# Patient Record
Sex: Female | Born: 1995 | Race: Black or African American | Hispanic: No | Marital: Single | Smoking: Never smoker
Health system: Southern US, Community
[De-identification: ages and names within clinical notes are randomized; demographics above are authoritative.]

## PROBLEM LIST (undated history)

## (undated) DIAGNOSIS — R21 Rash and other nonspecific skin eruption: Secondary | ICD-10-CM

## (undated) DIAGNOSIS — R079 Chest pain, unspecified: Secondary | ICD-10-CM

## (undated) DIAGNOSIS — I319 Disease of pericardium, unspecified: Secondary | ICD-10-CM

## (undated) DIAGNOSIS — J45909 Unspecified asthma, uncomplicated: Secondary | ICD-10-CM

## (undated) DIAGNOSIS — E669 Obesity, unspecified: Secondary | ICD-10-CM

## (undated) HISTORY — DX: Rash and other nonspecific skin eruption: R21

## (undated) HISTORY — DX: Unspecified asthma, uncomplicated: J45.909

## (undated) HISTORY — DX: Obesity, unspecified: E66.9

## (undated) HISTORY — DX: Disease of pericardium, unspecified: I31.9

## (undated) HISTORY — DX: Chest pain, unspecified: R07.9

---

## 2017-12-17 ENCOUNTER — Encounter (INDEPENDENT_AMBULATORY_CARE_PROVIDER_SITE_OTHER): Payer: Self-pay

## 2017-12-17 ENCOUNTER — Encounter: Payer: Self-pay | Admitting: Interventional Cardiology

## 2017-12-17 ENCOUNTER — Ambulatory Visit: Payer: PRIVATE HEALTH INSURANCE | Admitting: Interventional Cardiology

## 2017-12-17 VITALS — BP 110/82 | HR 76 | Ht 62.0 in | Wt 233.2 lb

## 2017-12-17 DIAGNOSIS — Z8679 Personal history of other diseases of the circulatory system: Secondary | ICD-10-CM

## 2017-12-17 DIAGNOSIS — R0781 Pleurodynia: Secondary | ICD-10-CM | POA: Diagnosis not present

## 2017-12-17 MED ORDER — COLCHICINE 0.6 MG PO TABS: 0.6000 mg | ORAL_TABLET | Freq: Every day | ORAL | 3 refills | Status: AC

## 2017-12-17 NOTE — Progress Notes (Signed)
Cardiology Office Note    Date:  12/17/2017   ID:  Briana Kidd, DOB December 15, 1995, MRN [id number]  PCP:  System, Pcp Not In  Cardiologist: Lesleigh Noe, MD   Chief Complaint  Patient presents with  . Chest Pain    Pericarditis    History of Present Illness:  Briana Kidd is a 22 y.o. female referred from primary care Briana Doffing MD, for pleuritic chest pain evaluation.  Has prior history of pericarditis and reactive airways disease.  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  She is from the [place] part of [place].  She began having chest pain in August 2018.  She eventually had the diagnosis of acute pericarditis made by Dr.Tiwari.  The clinical diagnosis was made based upon the quality of pain and concern for the presence of ST elevation on EKG.  Subsequent EKGs have not shown any evolutionary pattern or change in appearance.  She used high dose ibuprofen and colchicine without resolution of symptoms.  She is here today because she is run out of colchicine and wants to continue the medication.  States that the discomfort gets slightly worse with heavy physical activity such as running and physical education classes.  Of activities have no impact.  The discomfort occurs in different locations in the chest including left parasternal upper and lower as well as in the right lateral chest.  She is currently having 7 out of 10 intensity discomfort although is able to carry on a conversation and appears in no distress.   Past Medical History:  Diagnosis Date  . Chest pain   . Obesity   . Pericarditis   . RAD (reactive airway disease)   . Rash       Current Medications: Outpatient Medications Prior to Visit  Medication Sig Dispense Refill  . Multiple Vitamin (MULTIVITAMIN WITH MINERALS) TABS tablet Take 1 tablet by mouth daily.    . colchicine 0.6 MG tablet Take 0.6 mg by mouth daily.    Marland Kitchen ibuprofen  (ADVIL,MOTRIN) 600 MG tablet Take 600 mg by mouth 3 (three) times daily.    Marland Kitchen PRAMOXINE HCL EX 60 g. APPLY TO AFFECTED AREA THREE TIMES A DAY     No facility-administered medications prior to visit.      Allergies:   Patient has no known allergies.   Social History   Socioeconomic History  . Marital status: Single    Spouse name: None  . Number of children: None  . Years of education: None  . Highest education level: None  Social Needs  . Financial resource strain: None  . Food insecurity - worry: None  . Food insecurity - inability: None  . Transportation needs - medical: None  . Transportation needs - non-medical: None  Occupational History  . None  Tobacco Use  . Smoking status: Never Smoker  . Smokeless tobacco: Never Used  Substance and Sexual Activity  . Alcohol use: No    Frequency: Never  . Drug use: No  . Sexual activity: None  Other Topics Concern  . None  Social History Narrative  . None     Family History:  The patient's family history includes Diabetes in her maternal grandmother and son; Hypertension in her father and mother.   ROS:   Please see the history of present illness.    Episodes of chest pressure.  But also described as a soreness in the chest.  Certain movements and positions can exacerbate the discomfort.  Occurrences are completely random.  Exertional discomfort is not predictable.  Discomfort has variable threshold when it does occur.  All other systems reviewed and are negative.   PHYSICAL EXAM:   VS:  BP 110/82   Pulse 76   Ht 5\' 2"  (1.575 m)   Wt 233 lb 3.2 oz (105.8 kg)   BMI 42.65 kg/m    GEN: Well nourished, well developed, in no acute distress.  Morbidly obese. HEENT: normal  Neck: no JVD, carotid bruits, or masses Cardiac: RRR; no murmurs, rubs, or gallops,no edema  Respiratory:  clear to auscultation bilaterally, normal work of breathing GI: soft, nontender, nondistended, + BS MS: no deformity or atrophy  Skin: warm and  dry, no rash Neuro:  Alert and Oriented x 3, Strength and sensation are intact Psych: euthymic mood, full affect  Wt Readings from Last 3 Encounters:  12/17/17 233 lb 3.2 oz (105.8 kg)      Studies/Labs Reviewed:   EKG:  EKG normal sinus rhythm with probable early repolarization.  No change when compared to historical EKG from December 2018.  Recent Labs: No results found for requested labs within last 8760 hours.   Lipid Panel No results found for: CHOL, TRIG, HDL, CHOLHDL, VLDL, LDLCALC, LDLDIRECT  Additional studies/ records that were reviewed today include:  2D Doppler echocardiogram 06/24/2017: Conclusion The left ventricular size, thickness and function are normal. Ejection Fraction = 60-65%. The left ventricular wall motion is normal. Normal left ventricular diastolic function. There is trace tricuspid regurgitation.  2018 Sedimentation rate in the 25-28 range. 2018 high sensitivity CRP 9.3 (December 2018) <--11.3 (August 2018)   ASSESSMENT:    1. History of pericarditis   2. Pleuritic chest pain      PLAN:  In order of problems listed above:  1. If it was previously present it was based on clinical accumulation of data but no real objective evidence such as diagnostic EKG or the presence of a pericardial friction rub.  The diagnosis is clinical without objective findings of rub, diagnostic EKG, or pericardial effusion. 2. Uncertain etiology.  Could be costochondritis syndrome.  Current plan is clinical observation.  Discontinue ibuprofen as it does not appear to be helping very much.  Continue colchicine 0.6 mg/day for another several months and then we will discontinue.  Clinical follow-up in 3 months for reevaluation.  Considerations for the future could include connective tissue disease workup, and chest CT.    Medication Adjustments/Labs and Tests Ordered: Current medicines are reviewed at length with the patient today.  Concerns regarding medicines are  outlined above.  Medication changes, Labs and Tests ordered today are listed in the Patient Instructions below. Patient Instructions  Medication Instructions:  1) DISCONTINUE Ibuprofen 2) Continue Colchicine 0.6mg  once daily  Labwork: None  Testing/Procedures: None  Follow-Up: Your physician recommends that you schedule a follow-up appointment in: 3 months with Dr. Katrinka BlazingSmith.    Any Other Special Instructions Will Be Listed Below (If Applicable).     If you need a refill on your cardiac medications before your next appointment, please call your pharmacy.      Signed, Lesleigh NoeHenry W Eluzer Howdeshell III, MD  12/17/2017 3:58 PM    [facility] [address], [place], [place]  [address] Phone: [phone]) [phone]; Fax: [phone]) [phone]

## 2017-12-17 NOTE — Patient Instructions (Signed)
Medication Instructions:  1) DISCONTINUE Ibuprofen 2) Continue Colchicine 0.6mg  once daily  Labwork: None  Testing/Procedures: None  Follow-Up: Your physician recommends that you schedule a follow-up appointment in: 3 months with Dr. Katrinka BlazingSmith.    Any Other Special Instructions Will Be Listed Below (If Applicable).     If you need a refill on your cardiac medications before your next appointment, please call your pharmacy.

## 2018-03-03 ENCOUNTER — Encounter: Payer: Self-pay | Admitting: Interventional Cardiology

## 2018-03-16 NOTE — Progress Notes (Deleted)
Cardiology Office Note    Date:  03/16/2018   ID:  Briana Kidd, DOB 12-26-1995, MRN [id number]  PCP:  System, Pcp Not In  Cardiologist: Lesleigh Noe, MD   No chief complaint on file.   History of Present Illness:  Briana Kidd is a 22 y.o. female with h/o pleuritic chest pain and possibly prior pericarditis.    Past Medical History:  Diagnosis Date  . Chest pain   . Obesity   . Pericarditis   . RAD (reactive airway disease)   . Rash     No past surgical history on file.  Current Medications: Outpatient Medications Prior to Visit  Medication Sig Dispense Refill  . colchicine 0.6 MG tablet Take 1 tablet (0.6 mg total) by mouth daily. 90 tablet 3  . Multiple Vitamin (MULTIVITAMIN WITH MINERALS) TABS tablet Take 1 tablet by mouth daily.     No facility-administered medications prior to visit.      Allergies:   Patient has no known allergies.   Social History   Socioeconomic History  . Marital status: Single    Spouse name: Not on file  . Number of children: Not on file  . Years of education: Not on file  . Highest education level: Not on file  Occupational History  . Not on file  Social Needs  . Financial resource strain: Not on file  . Food insecurity:    Worry: Not on file    Inability: Not on file  . Transportation needs:    Medical: Not on file    Non-medical: Not on file  Tobacco Use  . Smoking status: Never Smoker  . Smokeless tobacco: Never Used  Substance and Sexual Activity  . Alcohol use: No    Frequency: Never  . Drug use: No  . Sexual activity: Not on file  Lifestyle  . Physical activity:    Days per week: Not on file    Minutes per session: Not on file  . Stress: Not on file  Relationships  . Social connections:    Talks on phone: Not on file    Gets together: Not on file    Attends religious service: Not on file    Active member of club or organization: Not on file    Attends meetings of clubs or organizations: Not on  file    Relationship status: Not on file  Other Topics Concern  . Not on file  Social History Narrative  . Not on file     Family History:  The patient's ***family history includes Diabetes in her maternal grandmother and son; Hypertension in her father and mother.   ROS:   Please see the history of present illness.    ***  All other systems reviewed and are negative.   PHYSICAL EXAM:   VS:  There were no vitals taken for this visit.   GEN: Well nourished, well developed, in no acute distress  HEENT: normal  Neck: no JVD, carotid bruits, or masses Cardiac: ***RRR; no murmurs, rubs, or gallops,no edema  Respiratory:  clear to auscultation bilaterally, normal work of breathing GI: soft, nontender, nondistended, + BS MS: no deformity or atrophy  Skin: warm and dry, no rash Neuro:  Alert and Oriented x 3, Strength and sensation are intact Psych: euthymic mood, full affect  Wt Readings from Last 3 Encounters:  12/17/17 233 lb 3.2 oz (105.8 kg)      Studies/Labs Reviewed:   EKG:  EKG  ***  Recent Labs: No results found for requested labs within last 8760 hours.   Lipid Panel No results found for: CHOL, TRIG, HDL, CHOLHDL, VLDL, LDLCALC, LDLDIRECT  Additional studies/ records that were reviewed today include:  ***    ASSESSMENT:    No diagnosis found.   PLAN:  In order of problems listed above:  1. ***    Medication Adjustments/Labs and Tests Ordered: Current medicines are reviewed at length with the patient today.  Concerns regarding medicines are outlined above.  Medication changes, Labs and Tests ordered today are listed in the Patient Instructions below. There are no Patient Instructions on file for this visit.   Signed, Lesleigh Noe, MD  03/16/2018 10:08 PM    [facility] [address] [place], [place], [place]  [address] Phone: [phone]; Fax: [phone]

## 2018-03-17 ENCOUNTER — Ambulatory Visit: Payer: PRIVATE HEALTH INSURANCE | Admitting: Interventional Cardiology

## 2018-03-17 DIAGNOSIS — R0989 Other specified symptoms and signs involving the circulatory and respiratory systems: Secondary | ICD-10-CM

## 2018-03-18 ENCOUNTER — Encounter: Payer: Self-pay | Admitting: Interventional Cardiology

## 2018-04-04 ENCOUNTER — Emergency Department (HOSPITAL_COMMUNITY)
Admission: EM | Admit: 2018-04-04 | Discharge: 2018-04-04 | Disposition: A | Discharge status: Home / Self Care | Payer: PRIVATE HEALTH INSURANCE | Attending: Emergency Medicine | Admitting: Emergency Medicine

## 2018-04-04 ENCOUNTER — Emergency Department (HOSPITAL_COMMUNITY): Payer: PRIVATE HEALTH INSURANCE

## 2018-04-04 ENCOUNTER — Encounter (HOSPITAL_COMMUNITY): Payer: Self-pay | Admitting: Emergency Medicine

## 2018-04-04 ENCOUNTER — Other Ambulatory Visit: Payer: Self-pay

## 2018-04-04 DIAGNOSIS — R0789 Other chest pain: Secondary | ICD-10-CM | POA: Insufficient documentation

## 2018-04-04 DIAGNOSIS — Z79899 Other long term (current) drug therapy: Secondary | ICD-10-CM | POA: Diagnosis not present

## 2018-04-04 HISTORY — DX: Unspecified asthma, uncomplicated: J45.909

## 2018-04-04 LAB — BASIC METABOLIC PANEL
ANION GAP: 7 (ref 5–15)
BUN: 9 mg/dL (ref 6–20)
CALCIUM: 9.3 mg/dL (ref 8.9–10.3)
CO2: 27 mmol/L (ref 22–32)
Chloride: 106 mmol/L (ref 101–111)
Creatinine, Ser: 0.74 mg/dL (ref 0.44–1.00)
GFR calc Af Amer: >60 mL/min (ref >60)
GFR calc non Af Amer: >60 mL/min (ref >60)
Glucose, Bld: 107 mg/dL — ABNORMAL HIGH (ref 65–99)
Potassium: 3.9 mmol/L (ref 3.5–5.1)
Sodium: 140 mmol/L (ref 135–145)

## 2018-04-04 LAB — CBC
HCT: 39.4 % (ref 36.0–46.0)
HEMOGLOBIN: 12.5 g/dL (ref 12.0–15.0)
MCH: 26 pg (ref 26.0–34.0)
MCHC: 31.7 g/dL (ref 30.0–36.0)
MCV: 82.1 fL (ref 78.0–100.0)
Platelets: 397 10*3/uL (ref 150–400)
RBC: 4.8 MIL/uL (ref 3.87–5.11)
RDW: 13.6 % (ref 11.5–15.5)
WBC: 8.4 10*3/uL (ref 4.0–10.5)

## 2018-04-04 LAB — I-STAT BETA HCG BLOOD, ED (MC, WL, AP ONLY)

## 2018-04-04 LAB — I-STAT TROPONIN, ED: TROPONIN I, POC: 0 ng/mL (ref 0.00–0.08)

## 2018-04-04 MED ORDER — ORPHENADRINE CITRATE ER 100 MG PO TB12: 100.0000 mg | ORAL_TABLET | Freq: Two times a day (BID) | ORAL | 0 refills | Status: AC

## 2018-04-04 MED ORDER — NAPROXEN 500 MG PO TABS: 500.0000 mg | ORAL_TABLET | Freq: Two times a day (BID) | ORAL | 0 refills | Status: AC

## 2018-04-04 MED ORDER — OMEPRAZOLE 20 MG PO CPDR: 20.0000 mg | DELAYED_RELEASE_CAPSULE | Freq: Every day | ORAL | 0 refills | Status: AC

## 2018-04-04 NOTE — Discharge Instructions (Addendum)
1.  You are due for a scheduled follow-up appointment with your cardiologist.  Call Monday to schedule recheck as soon as possible. 2.  Take naproxen and Norflex for pain.  Take omeprazole (Prilosec) daily to protect your stomach from inflammation or reflux while taking naproxen. 3.  Return to the emergency department if your symptoms worsen or change.  Return if you develop fever, shortness of breath, cough with sputum or blood.

## 2018-04-04 NOTE — ED Notes (Signed)
Pt departed in NAD, refused use of wheelchair.  

## 2018-04-04 NOTE — ED Notes (Signed)
ED Provider at bedside. 

## 2018-04-04 NOTE — ED Provider Notes (Signed)
[facility] EMERGENCY DEPARTMENT Provider Note   CSN: [id number] Arrival date & time: 04/04/18  1431     History   Chief Complaint Chief Complaint  Patient presents with  . Chest Pain    HPI Briana Kidd is a 22 y.o. female.  HPI Patient reports that she started developing chest pain this morning.  She reports that she feels it around in her back on the left and under her breast.  It is sharp in quality.  She reports is worse with movements or deep breaths.  No associated cough or fever.  No hemoptysis.  No lower extremity swelling or pain.  Patient is a non-smoker. Past Medical History:  Diagnosis Date  . Asthma   . Chest pain   . Obesity   . Pericarditis   . RAD (reactive airway disease)   . Rash     Patient Active Problem List   Diagnosis Date Noted  . Pleuritic chest pain 12/17/2017    History reviewed. No pertinent surgical history.   OB History   None      Home Medications    Prior to Admission medications   Medication Sig Start Date End Date Taking? Authorizing Provider  colchicine 0.6 MG tablet Take 1 tablet (0.6 mg total) by mouth daily. 12/17/17   Lyn Records, MD  Multiple Vitamin (MULTIVITAMIN WITH MINERALS) TABS tablet Take 1 tablet by mouth daily.    [provider]  naproxen (NAPROSYN) 500 MG tablet Take 1 tablet (500 mg total) by mouth 2 (two) times daily. 04/04/18   Arby Barrette, MD  omeprazole (PRILOSEC) 20 MG capsule Take 1 capsule (20 mg total) by mouth daily. 04/04/18   Arby Barrette, MD  orphenadrine (NORFLEX) 100 MG tablet Take 1 tablet (100 mg total) by mouth 2 (two) times daily. 04/04/18   Arby Barrette, MD    Family History Family History  Problem Relation Age of Onset  . Hypertension Mother   . Hypertension Father   . Diabetes Maternal Grandmother   . Diabetes Son     Social History Social History   Tobacco Use  . Smoking status: Never Smoker  . Smokeless tobacco: Never Used  Substance  Use Topics  . Alcohol use: No    Frequency: Never  . Drug use: No     Allergies   Patient has no known allergies.   Review of Systems Review of Systems 10 Systems reviewed and are negative for acute change except as noted in the HPI.   Physical Exam Updated Vital Signs BP 113/68 (BP Location: Right Arm)   Pulse 79   Temp 98.6 F (37 C) (Oral)   Resp 18   LMP 03/27/2018   SpO2 99%   Physical Exam  Constitutional: She is oriented to person, place, and time. She appears well-developed and well-nourished.  HENT:  Head: Normocephalic and atraumatic.  Eyes: EOM are normal.  Neck: Neck supple.  Cardiovascular: Normal rate, regular rhythm, normal heart sounds and intact distal pulses.  Pulmonary/Chest: Effort normal and breath sounds normal. She exhibits tenderness.  Patient endorses tenderness to palpation over the upper thoracic back.  She has tenderness over the subscapularis area and the paraspinous region.  She has no corresponding tenderness to the right upper back.  There is tenderness to palpation along the lateral chest at about the third through the sixth ribs.  Abdominal: Soft. Bowel sounds are normal. She exhibits no distension. There is no tenderness.  Musculoskeletal: Normal range of motion.  She exhibits no edema or tenderness.  No peripheral edema and no calf tenderness.  Neurological: She is alert and oriented to person, place, and time. She has normal strength. Coordination normal. GCS eye subscore is 4. GCS verbal subscore is 5. GCS motor subscore is 6.  Skin: Skin is warm, dry and intact.  Psychiatric: She has a normal mood and affect.     ED Treatments / Results  Labs (all labs ordered are listed, but only abnormal results are displayed) Labs Reviewed  BASIC METABOLIC PANEL - Abnormal; Notable for the following components:      Result Value   Glucose, Bld 107 (*)    All other components within normal limits  CBC  I-STAT TROPONIN, ED  I-STAT BETA HCG  BLOOD, ED ([facility], [facility], [facility] ONLY)    EKG EKG Interpretation  Date/Time:  Saturday Apr 04 2018 14:41:13 EDT Ventricular Rate:  86 PR Interval:  134 QRS Duration: 86 QT Interval:  356 QTC Calculation: 426 R Axis:   81 Text Interpretation:  Normal sinus rhythm with sinus arrhythmia Normal ECG normal, no old comparison. Confirmed by Arby Barrette [phone]) on 04/04/2018 7:02:45 PM   Radiology Dg Chest 2 View  Result Date: 04/04/2018 CLINICAL DATA:  Lower chest and left shoulder pain with some shortness of breath today. Generalized chest pain for the past year. History of asthma. EXAM: CHEST - 2 VIEW COMPARISON:  None. FINDINGS: The heart size and mediastinal contours are within normal limits. Both lungs are clear. The visualized skeletal structures are unremarkable. IMPRESSION: Normal examination. Electronically Signed   By: Beckie Salts M.D.   On: 04/04/2018 15:21    Procedures Procedures (including critical care time)  Medications Ordered in ED Medications - No data to display   Initial Impression / Assessment and Plan / ED Course  I have reviewed the triage vital signs and the nursing notes.  Pertinent labs & imaging results that were available during my care of the patient were reviewed by me and considered in my medical decision making (see chart for details).       Final Clinical Impressions(s) / ED Diagnoses   Final diagnoses:  Chest wall pain   Patient presents as outlined above.  She has a history of persistent and recurrent chest pain.  Patient has clinically been diagnosed with pericarditis in the past.  EMR contains cardiology consult note from earlier this year.  Patient has had echocardiogram that is normal in Dr. Verdis Prime note dated 2\6\2019.  Troponin is negative.  White count is normal.  Chest x-ray is normal.  Vital signs are normal.  At this time, based on physical examination that has reproducible muscular tenderness along the posterior thoracic chest wall and  upper shoulder as well as lateral and anterior chest, plan will be to treat for musculoskeletal chest pain with naproxen and Norflex.  Patient is counseled to follow-up with her cardiologist as she is due for a 101-month follow-up at this time.  Return precautions reviewed. ED Discharge Orders        Ordered    orphenadrine (NORFLEX) 100 MG tablet  2 times daily     04/04/18 1914    naproxen (NAPROSYN) 500 MG tablet  2 times daily     04/04/18 1914    omeprazole (PRILOSEC) 20 MG capsule  Daily     04/04/18 1917       Arby Barrette, MD 04/04/18 1946

## 2018-04-04 NOTE — ED Triage Notes (Signed)
Patient complains of sharp chest pain that started at 1100 this morning. States she had this pain x1 year and in the past she has prescribed colchicine but it did not help. Pain today radiates from right to left chest and also to her back. Denies nausea, vomiting, complains of shortness of breath.

## 2019-01-31 IMAGING — DX DG CHEST 2V
2 series · 2 of 2 positions shown · non-contrast
Comparison: None.

CLINICAL DATA: Lower chest and left shoulder pain with some
shortness of breath today. Generalized chest pain for the past year.
History of asthma.

EXAM:
CHEST - 2 VIEW

[w chest pa]
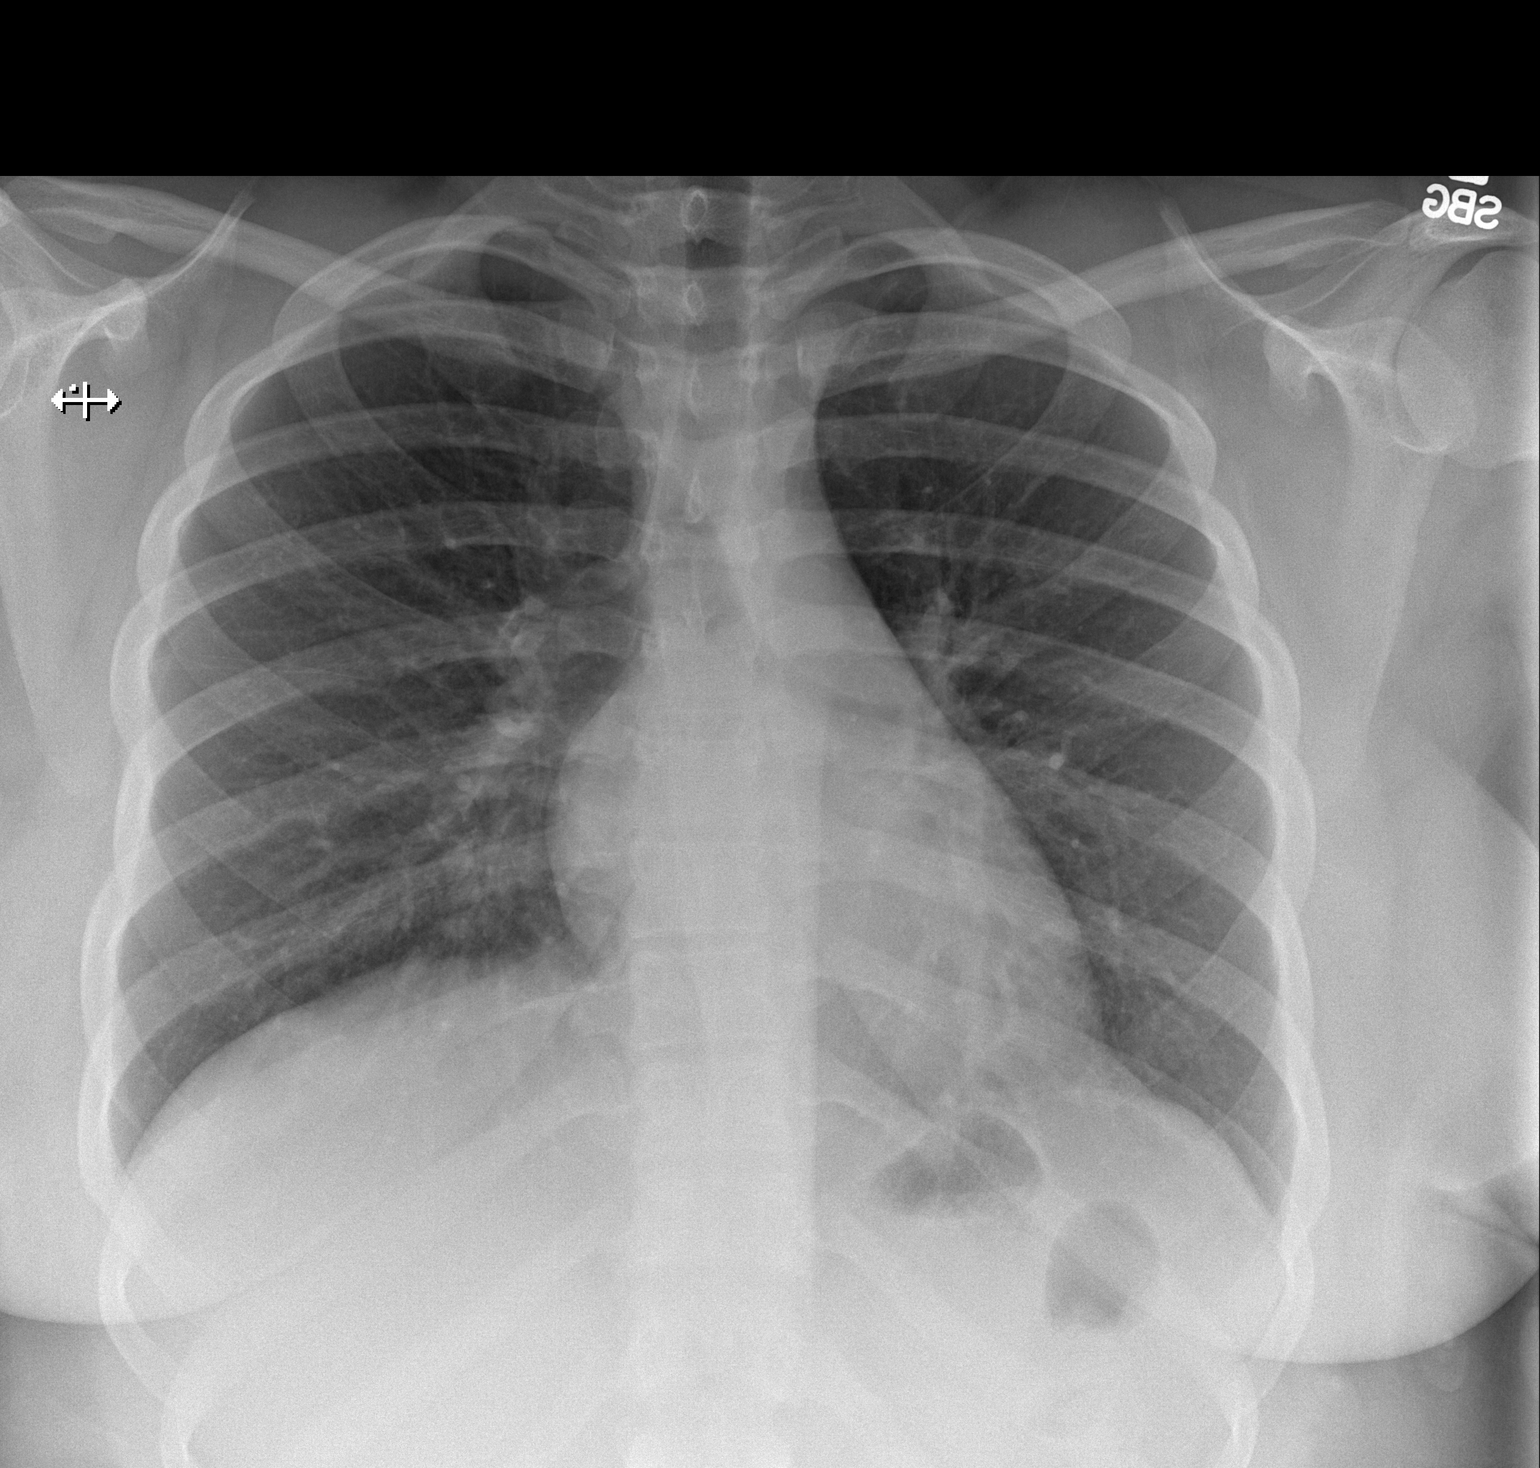

[w chest lat]
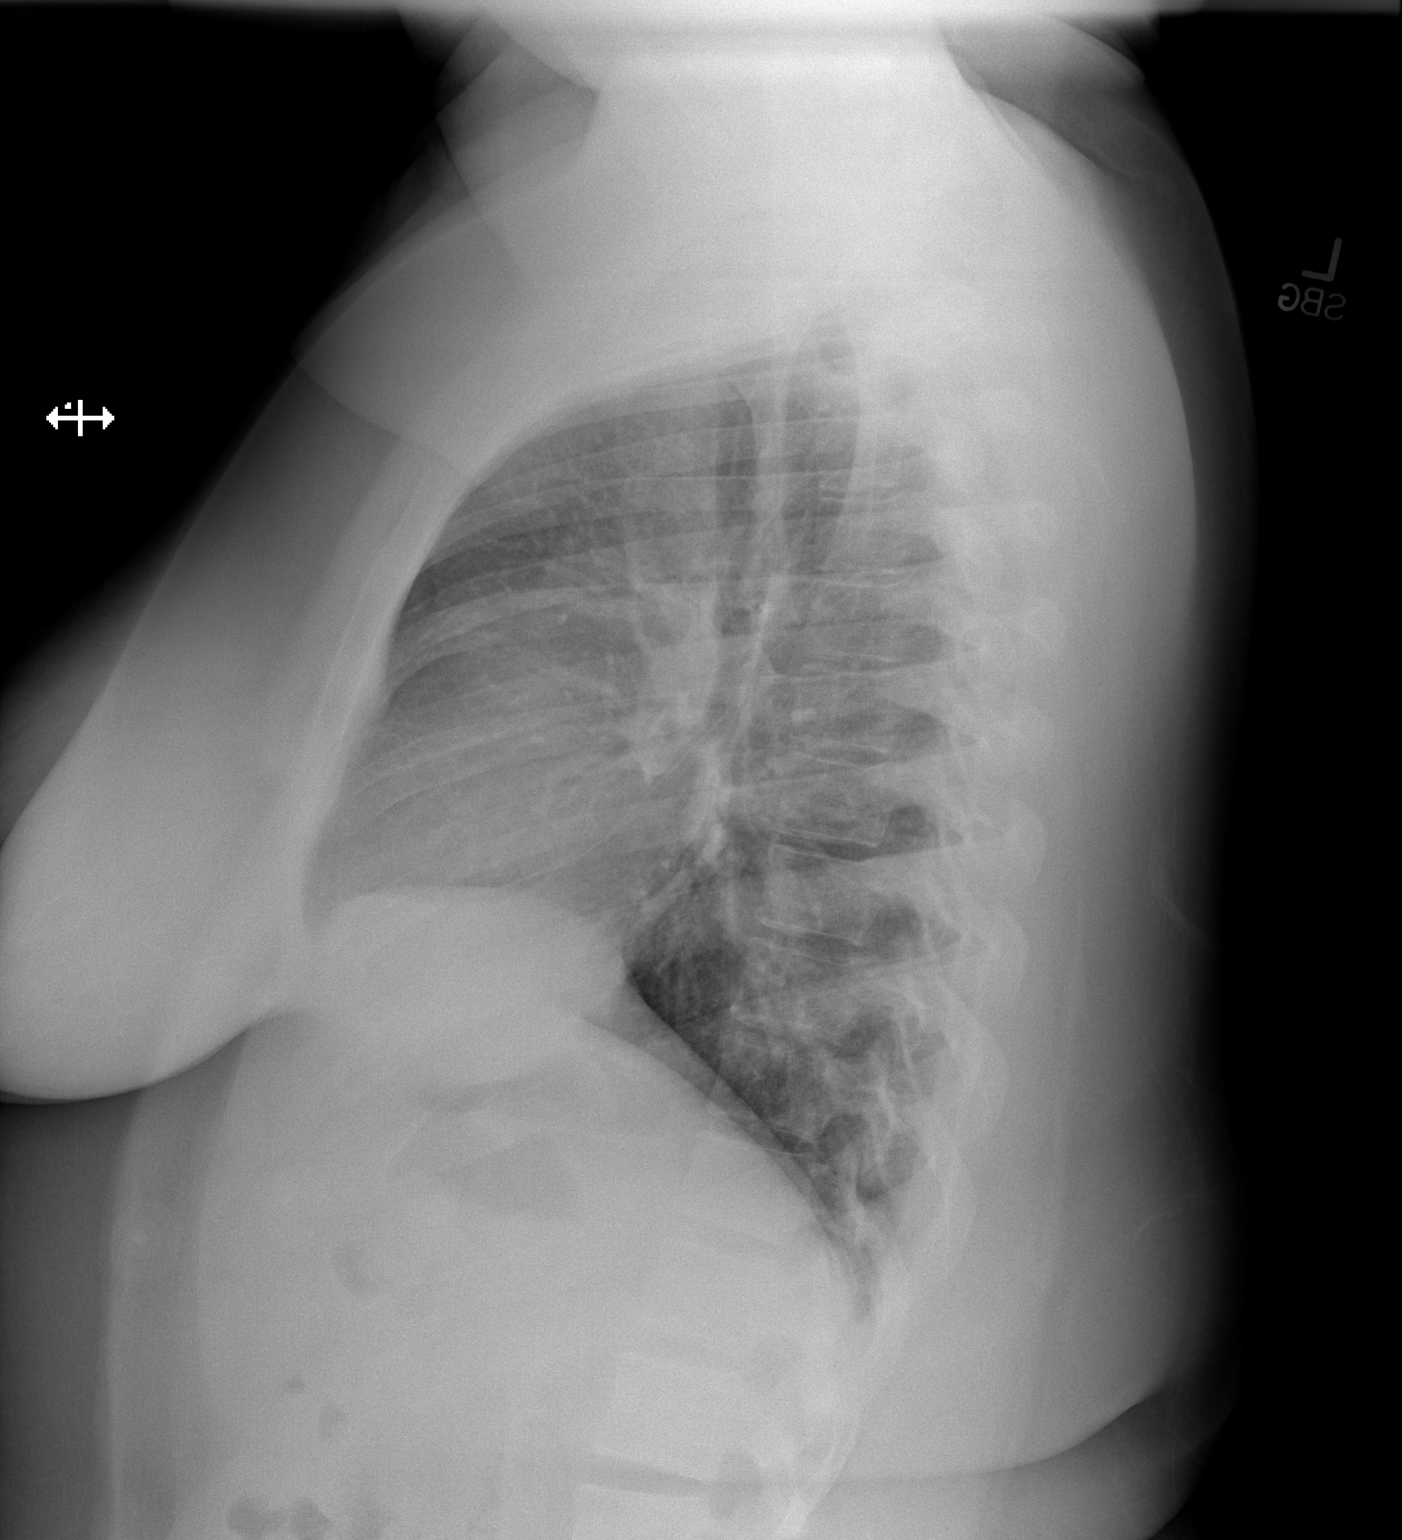

[2 of 2 positions shown; findings below may reference images not displayed]

FINDINGS: The heart size and mediastinal contours are within normal limits.
Both lungs are clear. The visualized skeletal structures are
unremarkable.
IMPRESSION: Normal examination.

## 2020-02-10 ENCOUNTER — Ambulatory Visit: Payer: PRIVATE HEALTH INSURANCE | Attending: Family

## 2020-02-10 DIAGNOSIS — Z23 Encounter for immunization: Secondary | ICD-10-CM

## 2020-02-10 NOTE — Progress Notes (Signed)
   Covid-19 Vaccination Clinic  Name:  Briana Kidd    MRN: [id number] DOB: 07-08-1996  02/10/2020  [name] was observed post Covid-19 immunization for 15 minutes without incident. She was provided with Vaccine Information Sheet and instruction to access the V-Safe system.   [name] was instructed to call 911 with any severe reactions post vaccine: Marland Kitchen Difficulty breathing  . Swelling of face and throat  . A fast heartbeat  . A bad rash all over body  . Dizziness and weakness   Immunizations Administered    Name Date Dose VIS Date Route   Moderna COVID-19 Vaccine 02/10/2020  4:05 PM 0.5 mL 10/12/2019 Intramuscular   Manufacturer: Moderna   Lot: [id number]   NDC: 56861-683-72

## 2020-03-14 ENCOUNTER — Ambulatory Visit: Payer: PRIVATE HEALTH INSURANCE | Attending: Family

## 2020-03-14 DIAGNOSIS — Z23 Encounter for immunization: Secondary | ICD-10-CM

## 2020-03-14 NOTE — Progress Notes (Signed)
   Covid-19 Vaccination Clinic  Name:  Briana Kidd    MRN: [id number] DOB: 01/24/1996  03/14/2020  [name] was observed post Covid-19 immunization for 15 minutes without incident. She was provided with Vaccine Information Sheet and instruction to access the V-Safe system.   [name] was instructed to call 911 with any severe reactions post vaccine: Marland Kitchen Difficulty breathing  . Swelling of face and throat  . A fast heartbeat  . A bad rash all over body  . Dizziness and weakness   Immunizations Administered    Name Date Dose VIS Date Route   Moderna COVID-19 Vaccine 03/14/2020  4:09 PM 0.5 mL 10/2019 Intramuscular   Manufacturer: Moderna   Lot: [id number]   NDC: 94944-739-58
